# Patient Record
Sex: Male | Born: 2000 | Race: Black or African American | Hispanic: No | Marital: Single | Smoking: Never smoker
Health system: Southern US, Community
[De-identification: ages and names within clinical notes are randomized; demographics above are authoritative.]

## PROBLEM LIST (undated history)

## (undated) ENCOUNTER — Emergency Department (HOSPITAL_COMMUNITY): Discharge status: Against Medical Advice | Payer: No Typology Code available for payment source

---
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
## 2020-08-09 ENCOUNTER — Other Ambulatory Visit: Payer: Self-pay

## 2020-08-09 ENCOUNTER — Ambulatory Visit
Admission: EM | Admit: 2020-08-09 | Discharge: 2020-08-09 | Discharge status: Against Medical Advice | Payer: BLUE CROSS/BLUE SHIELD

## 2020-08-09 NOTE — ED Triage Notes (Signed)
Patient did not wish to be seen after being advised that his insurance was not in network.

## 2021-01-17 ENCOUNTER — Other Ambulatory Visit: Payer: Self-pay

## 2021-01-17 ENCOUNTER — Emergency Department (HOSPITAL_COMMUNITY): Payer: Self-pay

## 2021-01-17 ENCOUNTER — Encounter (HOSPITAL_COMMUNITY): Payer: Self-pay | Admitting: Emergency Medicine

## 2021-01-17 ENCOUNTER — Emergency Department (HOSPITAL_COMMUNITY)
Admission: EM | Admit: 2021-01-17 | Discharge: 2021-01-17 | Disposition: A | Discharge status: Home / Self Care | Payer: Self-pay | Attending: Emergency Medicine | Admitting: Emergency Medicine

## 2021-01-17 DIAGNOSIS — Z7982 Long term (current) use of aspirin: Secondary | ICD-10-CM | POA: Insufficient documentation

## 2021-01-17 DIAGNOSIS — X509XXA Other and unspecified overexertion or strenuous movements or postures, initial encounter: Secondary | ICD-10-CM | POA: Insufficient documentation

## 2021-01-17 DIAGNOSIS — M25561 Pain in right knee: Secondary | ICD-10-CM | POA: Insufficient documentation

## 2021-01-17 NOTE — Progress Notes (Signed)
Orthopedic Tech Progress Note Patient Details:  Samuel Cross 09-20-2001 [id number]  Ortho Devices Type of Ortho Device: Knee Immobilizer,Crutches Ortho Device/Splint Location: rle Ortho Device/Splint Interventions: Ordered,Application,Adjustment   Post Interventions Patient Tolerated: Well Instructions Provided: Care of device,Adjustment of device   [name] 01/17/2021, 10:00 PM

## 2021-01-17 NOTE — ED Provider Notes (Signed)
Pt is a 20 year old male presenting with a complaint of right knee pain, he states that he not infrequently dislocates his patella and usually self reduces.  Today he is having increasing pain and difficulty keeping it in position.  On my exam the patient has very thin legs, he has what appears to be some slight muscular atrophy of the medial quad muscles bilaterally and a very supple patella bilaterally.  He is able to straight leg raise but with some difficulty, there is some tenderness on the proximal medial patellar edge.  X-rays are unremarkable, will place patient in a knee immobilizer have follow-up with orthopedics, NSAIDs, patient agreeable.  Medical screening examination/treatment/procedure(s) were conducted as a shared visit with non-physician practitioner(s) and myself.  I personally evaluated the patient during the encounter.  Clinical Impression:   Final diagnoses:  Acute pain of right knee         Eber Hong, MD 01/19/21 1044

## 2021-01-17 NOTE — ED Triage Notes (Signed)
Pt c/o right knee pain, states when he stood up he felt a "pop" and has had pain since.

## 2021-01-17 NOTE — Discharge Instructions (Addendum)
You came to the emergency department today to be evaluated for your right knee pain.  Your x-ray was unremarkable.  Your physical exam showed that you had some laxity to your kneecap.  Due to this we will place you in a knee immobilizer and have you follow-up with the orthopedic physician.  Please take Ibuprofen (Advil, motrin) to relieve your pain.    You may take up to 600 MG (3 pills) of normal strength ibuprofen every 8 hours as needed.    It is safe to take ibuprofen and tylenol at the same time as they work differently.   Do not take more than 3,000 mg tylenol in a 24 hour period (not more than one dose every 8 hours.  Please check all medication labels as many medications such as pain and cold medications may contain tylenol.  Do not drink alcohol while taking these medications.  Do not take other NSAID'S while taking ibuprofen (such as aleve or naproxen).  Please take ibuprofen with food to decrease stomach upset.  Get help right away if: You cannot move the joint. Your fingers or toes tingle, become numb, or turn cold and blue. You have a fever along with a joint that is red, warm, and swollen.

## 2021-01-17 NOTE — ED Notes (Signed)
Ortho tech called 

## 2021-01-18 NOTE — ED Provider Notes (Signed)
[facility] EMERGENCY DEPARTMENT Provider Note   CSN: [id number] Arrival date & time: 01/17/21  1946     History Chief Complaint  Patient presents with  . Knee Pain    Samuel Cross is a 20 y.o. male with a reported history of right knee pain.  Patient presents with a chief complaint of right knee pain.  Patient states that his pain started around 1500.  Pain started after rolling out of bed and moving to a standing position.  Patient felt a "pop," and felt like his knee moved out of position he started having pain.  Patient rates pain 7/10 on the pain scale.  Patient states pain has been constant and unchanged since this event.  Patient reports pain is worse with standing or straightening his leg.  Patient denies any alleviating factors.  Denies any swelling, bruising, or wounds, numbness or tingling sensation, or weakness.  Patient reports that in the past he has had similar sensations.  Patient reports usually his knee feels like it moves back into position and his pain resolves quickly.  He denies seeing any orthopedic physician for this issue.  Denies any recent falls or injuries.  HPI     History reviewed. No pertinent past medical history.  There are no problems to display for this patient.   History reviewed. No pertinent surgical history.     No family history on file.  Social History   Tobacco Use  . Smoking status: Never Smoker  Substance Use Topics  . Alcohol use: No  . Drug use: No    Home Medications Prior to Admission medications   Medication Sig Start Date End Date Taking? Authorizing Provider  Aspirin-Salicylamide-Caffeine (BC HEADACHE PO) Take 1 Package by mouth every 6 (six) hours as needed (pain).    [provider]  ibuprofen (ADVIL,MOTRIN) 100 MG/5ML suspension Take 300 mg by mouth every 4 (four) hours as needed for mild pain.    [provider]  Multiple Vitamin (MULTIVITAMIN WITH MINERALS) TABS tablet Take 1  tablet by mouth daily.    [provider]    Allergies    Penicillins  Review of Systems   Review of Systems  Musculoskeletal: Positive for arthralgias. Negative for back pain and neck pain.  Skin: Negative for color change and wound.  Neurological: Negative for weakness and numbness.    Physical Exam Updated Vital Signs BP 127/85 (BP Location: Right Arm)   Pulse 84   Temp 98.3 F (36.8 C) (Oral)   Resp 18   SpO2 98%   Physical Exam Vitals and nursing note reviewed.  Constitutional:      General: He is not in acute distress.    Appearance: He is not ill-appearing, toxic-appearing or diaphoretic.  HENT:     Head: Normocephalic.  Eyes:     General: No scleral icterus.       Right eye: No discharge.        Left eye: No discharge.  Cardiovascular:     Rate and Rhythm: Normal rate.     Pulses:          Dorsalis pedis pulses are 3+ on the right side.       Posterior tibial pulses are 3+ on the right side.  Pulmonary:     Effort: Pulmonary effort is normal. No respiratory distress.     Breath sounds: No stridor.  Musculoskeletal:     Cervical back: Normal range of motion.     Right  knee: No swelling, deformity, effusion, erythema, ecchymosis, lacerations, bony tenderness or crepitus. Normal range of motion. Tenderness present. No medial joint line, lateral joint line or patellar tendon tenderness. Abnormal patellar mobility. Normal alignment.     Instability Tests: Anterior drawer test negative. Posterior drawer test negative.     Left knee: No swelling, deformity, effusion, erythema, ecchymosis, lacerations, bony tenderness or crepitus. Normal range of motion. No tenderness. Normal alignment and normal patellar mobility.     Right lower leg: Normal.     Left lower leg: Normal.     Right ankle: No swelling, deformity, ecchymosis or lacerations. No tenderness. Normal range of motion. Normal pulse.     Right Achilles Tendon: No tenderness or defects.     Left ankle:  No swelling, deformity, ecchymosis or lacerations. No tenderness. Normal range of motion.     Left Achilles Tendon: No tenderness or defects.     Right foot: Normal range of motion and normal capillary refill. No swelling, deformity, laceration, tenderness or bony tenderness. Normal pulse.     Comments: Right knee: tenderness medial aspect above medial joint line; patella laxity; no tenderness to lateral joint line, patellar tendon, or quadriceps tendon; no bony tenderness  Patient able to perform straight leg raise No pain with passive range of motion Full range of motion  Skin:    General: Skin is warm and dry.     Coloration: Skin is not jaundiced or pale.     Findings: No bruising, erythema or rash.  Neurological:     General: No focal deficit present.     Mental Status: He is alert.  Psychiatric:        Behavior: Behavior is cooperative.     ED Results / Procedures / Treatments   Labs (all labs ordered are listed, but only abnormal results are displayed) Labs Reviewed - No data to display  EKG None  Radiology DG Knee Complete 4 Views Right  Result Date: 01/17/2021 CLINICAL DATA:  Pain. EXAM: RIGHT KNEE - COMPLETE 4+ VIEW COMPARISON:  None. FINDINGS: No evidence of acute displaced fracture, dislocation, or joint effusion. 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 No evidence of arthropathy or aggressive appearing bone abnormality. Soft tissues are unremarkable. IMPRESSION: Negative for acute findings. Electronically Signed   By: Tish Frederickson M.D.   On: 01/17/2021 20:24    Procedures Procedures   Medications Ordered in ED Medications - No data to display  ED Course  I have reviewed the triage vital signs and the nursing notes.  Pertinent labs & imaging results that were available during my care of the patient were reviewed by me and considered in my medical decision  making (see chart for details).    MDM Rules/Calculators/A&P                          Alert 20 year old male no acute distress, nontoxic-appearing.  Patient presents with chief complaint of right knee pain.  Reports that he felt a "pop," and thought that his kneecap moved out of position.  Patient reports that he has had similar episodes of this in the past however pain usually resolves quickly.  On entering the room patient noted to have knee flexed with foot planted on the hospital bed.   On physical exam right knee tenderness to medial aspect above medial joint line; patella laxity; no tenderness to lateral  joint line, patellar tendon, or quadriceps tendon; no bony tenderness.  Patella is in correct alignment.  Patient able to perform straight leg raise.  No pain with passive range of motion.  Full range of motion.  Posterior and anterior drawer test negative.    Low suspicion for quadriceps tendon or patella tendon tear.   Low suspicion for patella dislocation. X-ray obtained showed no acute fracture or dislocation; no joint effusion; tissues are unremarkable.  Will place patient in knee immobilizer, give him crutches for nonweightbearing status.  Patient to follow-up with orthopedic physician.  Patient to use NSAIDs as needed for pain management and to help reduce swelling.  Discussed results, findings, treatment and follow up. Patient advised of return precautions. Patient verbalized understanding and agreed with plan.  Patient was discussed with and evaluated by Dr. Hyacinth Meeker.     Final Clinical Impression(s) / ED Diagnoses Final diagnoses:  Acute pain of right knee    Rx / DC Orders ED Discharge Orders    None       Berneice Heinrich 01/18/21 0030    Eber Hong, MD 01/19/21 1044

## 2021-12-19 ENCOUNTER — Emergency Department (HOSPITAL_COMMUNITY): Payer: Self-pay

## 2021-12-19 ENCOUNTER — Encounter (HOSPITAL_COMMUNITY): Payer: Self-pay | Admitting: Emergency Medicine

## 2021-12-19 ENCOUNTER — Emergency Department (HOSPITAL_COMMUNITY)
Admission: EM | Admit: 2021-12-19 | Discharge: 2021-12-19 | Disposition: A | Discharge status: Home / Self Care | Payer: Self-pay | Attending: Emergency Medicine | Admitting: Emergency Medicine

## 2021-12-19 DIAGNOSIS — S0081XA Abrasion of other part of head, initial encounter: Secondary | ICD-10-CM | POA: Insufficient documentation

## 2021-12-19 DIAGNOSIS — Y92410 Unspecified street and highway as the place of occurrence of the external cause: Secondary | ICD-10-CM | POA: Insufficient documentation

## 2021-12-19 DIAGNOSIS — S60811A Abrasion of right wrist, initial encounter: Secondary | ICD-10-CM | POA: Diagnosis present

## 2021-12-19 DIAGNOSIS — R29818 Other symptoms and signs involving the nervous system: Secondary | ICD-10-CM | POA: Insufficient documentation

## 2021-12-19 DIAGNOSIS — S299XXA Unspecified injury of thorax, initial encounter: Secondary | ICD-10-CM | POA: Insufficient documentation

## 2021-12-19 MED ORDER — ACETAMINOPHEN 160 MG/5ML PO SOLN
650.0000 mg | Freq: Once | ORAL | Status: AC
  Administered 2021-12-19: 650 mg via ORAL
  Filled 2021-12-19: qty 20.3

## 2021-12-19 NOTE — Discharge Instructions (Addendum)
Call your primary care doctor or specialist as discussed in the next 2-3 days.   Return immediately back to the ER if:  Your symptoms worsen within the next 12-24 hours. You develop new symptoms such as new fevers, persistent vomiting, new pain, shortness of breath, or new weakness or numbness, or if you have any other concerns.  
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
## 2023-10-27 ENCOUNTER — Ambulatory Visit: Payer: Medicaid Other
